# Patient Record
Sex: Female | Born: 2005 | Race: White | Hispanic: No | Marital: Single | Smoking: Never smoker
Health system: Southern US, Community
[De-identification: ages and names within clinical notes are randomized; demographics above are authoritative.]

## PROBLEM LIST (undated history)

## (undated) HISTORY — PX: IRRIGATION AND DEBRIDEMENT SEBACEOUS CYST: SHX5255

---
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
## 2024-06-16 ENCOUNTER — Ambulatory Visit: Payer: Self-pay

## 2024-06-16 NOTE — Telephone Encounter (Signed)
 Patient sought treatment at urgent care. Patient is feeling better with antibiotic, flonase  and tylenol tx

## 2024-06-16 NOTE — Telephone Encounter (Signed)
 No availability in PCP office--Patient wants to see her PCP or someone in the PCP office & not go to Urgent Care per mother--please reach out to patient/patient's mother  FYI Only or Action Required?: Action required by provider: request for appointment.  Patient was last seen in primary care on 11/19/2023 by Cathlene Marry Lenis, FNP.  Called Nurse Triage reporting Facial Pain and Right Ear Pain.  Symptoms began yesterday.  Interventions attempted: OTC medications: Dayquil, Nyquil and Rest, hydration, or home remedies.  Symptoms are: gradually worsening.  Triage Disposition: See Physician Within 24 Hours  Patient/caregiver understands and will follow disposition?: no, wishes to speak with PCP            Copied from CRM #[id number]. Topic: Clinical - Red Word Triage >> Jun 16, 2024 12:43 PM Carlatta H wrote: [facility] that prompted transfer to Nurse Triage: Right ear infection and Sinus infection// Discolored mucus//    ----------------------------------------------------------------------- From previous Reason for Contact - Scheduling: Patient/patient representative is calling to schedule an appointment. Refer to attachments for appointment information. Reason for Disposition  Earache  (Exceptions: Brief ear pain of lasting less than 60 minutes, or earache occurring during air travel.)  Answer Assessment - Initial Assessment Questions Patient's mother called for patient and is with the patient Patient denies any nausea, vomiting, fevers, diarrhea, difficulty breathing, or chest pain States every year she has sinus problems Taking Dayquil and Nyquil Sinus congestion/pain and right ear pain today x 1 day No availability in PCP office Patient's mother is advised about Urgent Care and she states that the patient will refuse and just want to see her doctor This RN recommended that the patient goes to Urgent Care today or in the next day so she can be further evaluated and  assessed by a provider Mother states that the patient will want to wait and see her PCP Patient's mother is advised that if anything worsens to go to the Emergency Room. Patient's mother verbalized understanding.     1. LOCATION: Which ear is involved?     Right ear 2. ONSET: When did the ear pain start?      yesterday 3. SEVERITY: How bad is the pain?  (Scale 1-10; mild, moderate or severe)     6-7 4. URI SYMPTOMS: Do you have a runny nose or cough?     congestion 5. FEVER: Do you have a fever? If Yes, ask: What is your temperature, how was it measured, and when did it start?     no 6. CAUSE: Have you been swimming recently?, How often do you use Q-TIPS?, Have you had any recent air travel or scuba diving?     ---- 7. OTHER SYMPTOMS: Do you have any other symptoms? (e.g., decreased hearing, dizziness, headache, stiff neck, vomiting)     Sinus pressure, congestion,  8. PREGNANCY: Is there any chance you are pregnant? When was your last menstrual period?     Last menstrual period end of July  Protocols used: [facility]

## 2024-08-08 ENCOUNTER — Other Ambulatory Visit: Payer: Self-pay | Admitting: Adult Health
